# Patient Record
Sex: Female | Born: 1950 | Race: Black or African American | Hispanic: No | Marital: Single | Smoking: Never smoker
Health system: Southern US, Community
[De-identification: ages and names within clinical notes are randomized; demographics above are authoritative.]

## PROBLEM LIST (undated history)

## (undated) DIAGNOSIS — B1920 Unspecified viral hepatitis C without hepatic coma: Secondary | ICD-10-CM

## (undated) HISTORY — PX: TOTAL HIP ARTHROPLASTY: SHX124

## (undated) HISTORY — PX: BREAST EXCISIONAL BIOPSY: SUR124

---

## 2020-07-29 ENCOUNTER — Encounter: Payer: Self-pay | Admitting: Emergency Medicine

## 2020-07-29 ENCOUNTER — Other Ambulatory Visit: Payer: Self-pay

## 2020-07-29 ENCOUNTER — Emergency Department
Admission: EM | Admit: 2020-07-29 | Discharge: 2020-07-29 | Disposition: A | Discharge status: Home / Self Care | Payer: BC Managed Care – PPO | Attending: Emergency Medicine | Admitting: Emergency Medicine

## 2020-07-29 ENCOUNTER — Emergency Department: Payer: BC Managed Care – PPO

## 2020-07-29 DIAGNOSIS — M7989 Other specified soft tissue disorders: Secondary | ICD-10-CM | POA: Diagnosis not present

## 2020-07-29 DIAGNOSIS — Z96641 Presence of right artificial hip joint: Secondary | ICD-10-CM | POA: Diagnosis not present

## 2020-07-29 HISTORY — DX: Unspecified viral hepatitis C without hepatic coma: B19.20

## 2020-07-29 NOTE — ED Triage Notes (Signed)
First nurse note- here for [place], r/o dvt.  Recent surgery now swelling right calf. NAD

## 2020-07-29 NOTE — ED Provider Notes (Signed)
[facility] Emergency Department Provider Note   ____________________________________________    I have reviewed the triage vital signs and the nursing notes.   HISTORY  Chief Complaint Leg Swelling     HPI Latasha Hall is a 69 y.o. female who presents with complaints of right leg swelling and pain.  Patient recently had right hip replacement.  She was at physical therapy today complaining of pain and swelling and was sent to the emergency department for evaluation for possible DVT.  No chest pain shortness of breath or pleurisy.  No fevers or chills.  Reports gradually increased ambulation after surgery.  No history of DVTs in the past.  Past Medical History:  Diagnosis Date  . Hepatitis C     There are no problems to display for this patient.   Past Surgical History:  Procedure Laterality Date  . TOTAL HIP ARTHROPLASTY      Prior to Admission medications   Not on File     Allergies Other  History reviewed. No pertinent family history.  Social History Social History   Tobacco Use  . Smoking status: Never Smoker  . Smokeless tobacco: Never Used  Substance Use Topics  . Alcohol use: Never  . Drug use: Never    Review of Systems  Constitutional: No fever/chills  Cardiovascular: Denies chest pain. Respiratory: Denies shortness of breath. Gastrointestinal: No abdominal pain.  Genitourinary: Negative for dysuria. Musculoskeletal: As above Skin: Negative for rash. Neurological: Negative for headaches    ____________________________________________   PHYSICAL EXAM:  VITAL SIGNS: ED Triage Vitals  Enc Vitals Group     BP 07/29/20 1446 114/71     Pulse Rate 07/29/20 1446 65     Resp 07/29/20 1446 16     Temp 07/29/20 1446 98.5 F (36.9 C)     Temp Source 07/29/20 1446 Oral     SpO2 07/29/20 1446 98 %     Weight 07/29/20 1442 56.2 kg (124 lb)     Height 07/29/20 1442 1.524 m (5')     Head Circumference --      Peak  Flow --      Pain Score 07/29/20 1442 5     Pain Loc --      Pain Edu? --      Excl. in GC? --     Constitutional: Alert and oriented.  Pleasant and interactive  Nose: No congestion/rhinnorhea. Mouth/Throat: Mucous membranes are moist.   Neck:  Painless ROM Cardiovascular: Normal rate, regular rhythm. Grossly normal heart sounds.  Good peripheral circulation. Respiratory: Normal respiratory effort.  No retractions. Lungs CTAB. Gastrointestinal: Soft and nontender. No distention.  No CVA tenderness. Genitourinary: deferred Musculoskeletal: Mild tenderness palpation in the calf, edema to the leg noted, 2+ distal pulses, warm and well perfused, painful range of motion at the hip Neurologic:  Normal speech and language. No gross focal neurologic deficits are appreciated.  Skin:  Skin is warm, dry and intact. No rash noted. Psychiatric: Mood and affect are normal. Speech and behavior are normal.  ____________________________________________   LABS (all labs ordered are listed, but only abnormal results are displayed)  Labs Reviewed - No data to display ____________________________________________  EKG  None ____________________________________________  RADIOLOGY  Ultrasound negative for DVT ____________________________________________   PROCEDURES  Procedure(s) performed: No  Procedures   Critical Care performed: No ____________________________________________   INITIAL IMPRESSION / ASSESSMENT AND PLAN / ED COURSE  Pertinent labs & imaging results that were available during my care  of the patient were reviewed by me and considered in my medical decision making (see chart for details).  Patient sent in for evaluation of swelling to the right leg, concern for DVT given recent hip surgery.  Ultrasound demonstrates no evidence of DVT, patient greatly reassured by this, can continue physical therapy outpatient follow-up with orthopedics.      ____________________________________________   FINAL CLINICAL IMPRESSION(S) / ED DIAGNOSES  Final diagnoses:  Leg swelling        Note:  This document was prepared using Dragon voice recognition software and may include unintentional dictation errors.   Jene Every, MD 07/29/20 [id number]

## 2020-07-29 NOTE — ED Triage Notes (Signed)
Here for right calf swelling. Recent hip replacement.  Pt just noticed today, sent for r/o DVT

## 2021-06-18 IMAGING — US US EXTREM LOW VENOUS*R*
1 series · 13 of 24 positions shown · non-contrast
Comparison: None.

CLINICAL DATA: Right leg swelling.  Recent surgery



[Series 1: us venous img lower uni right (dvt) · portal-venous · 13 of 34 slices shown]
[im 1/34]
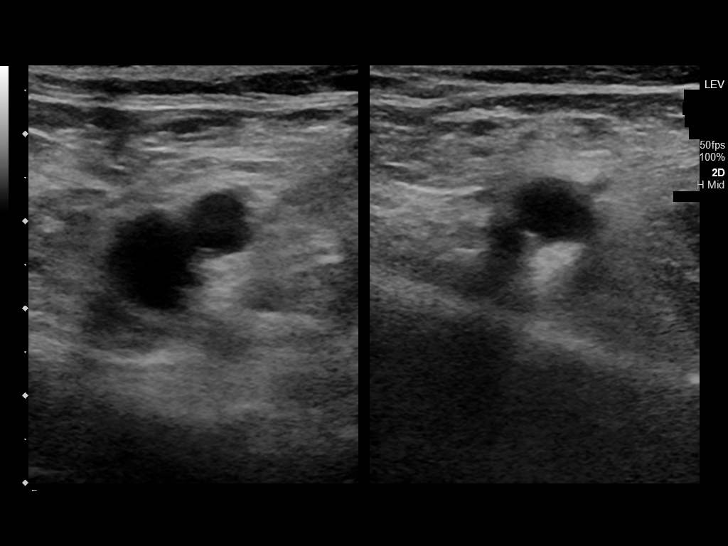
[im 3/34]
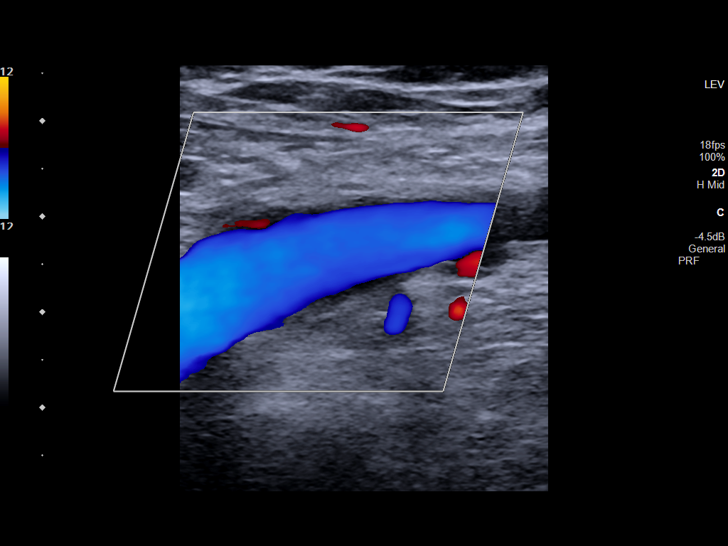
[im 6/34]
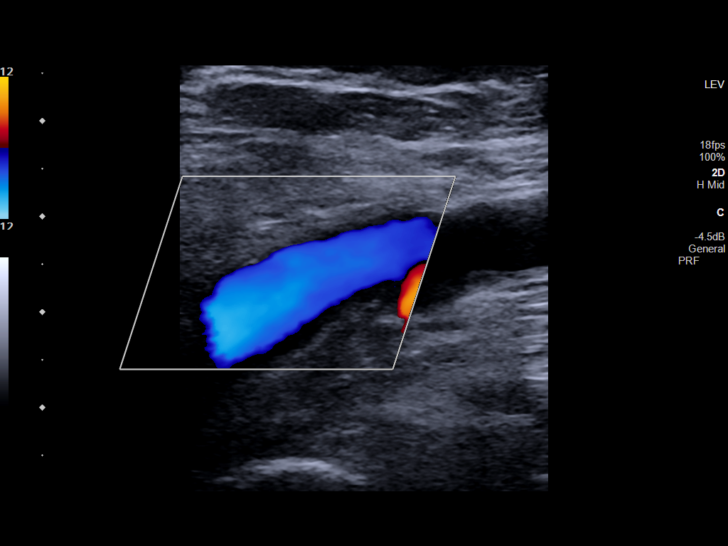
[im 9/34]
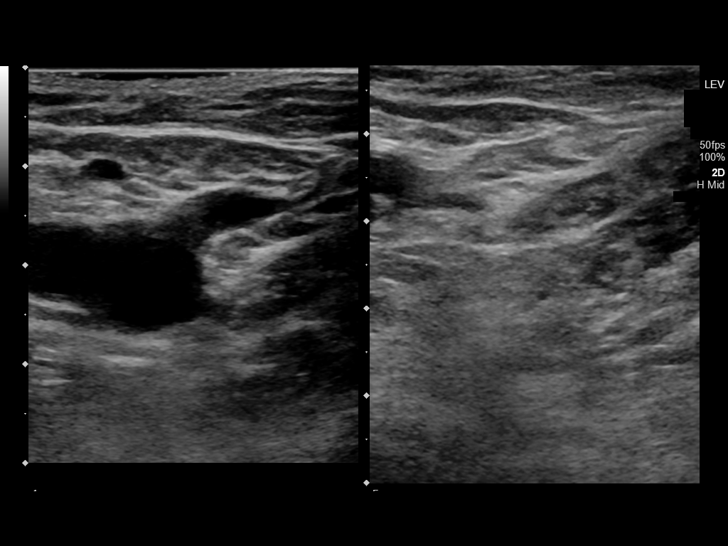
[im 12/34]
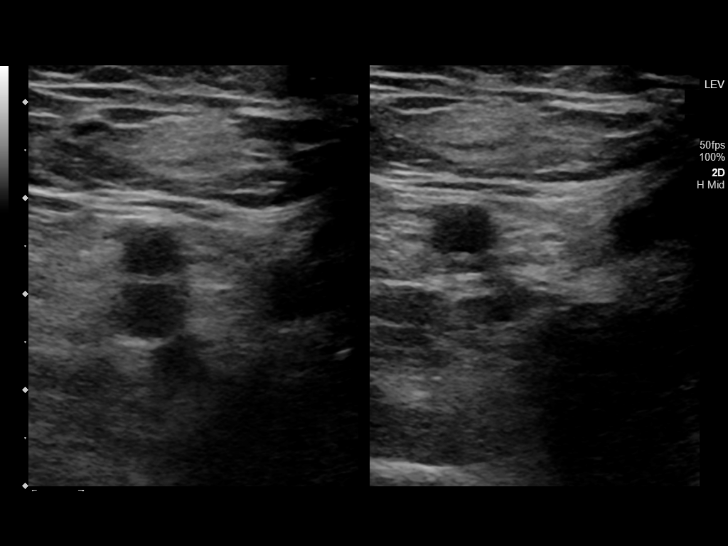
[im 15/34]
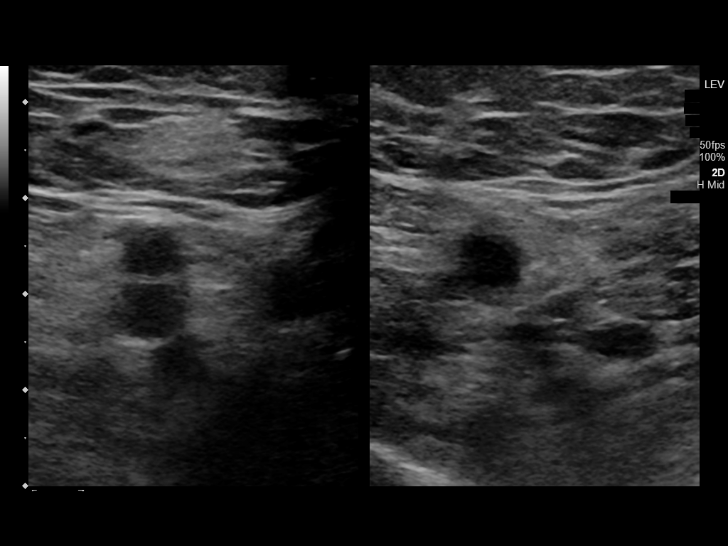
[im 18/34]
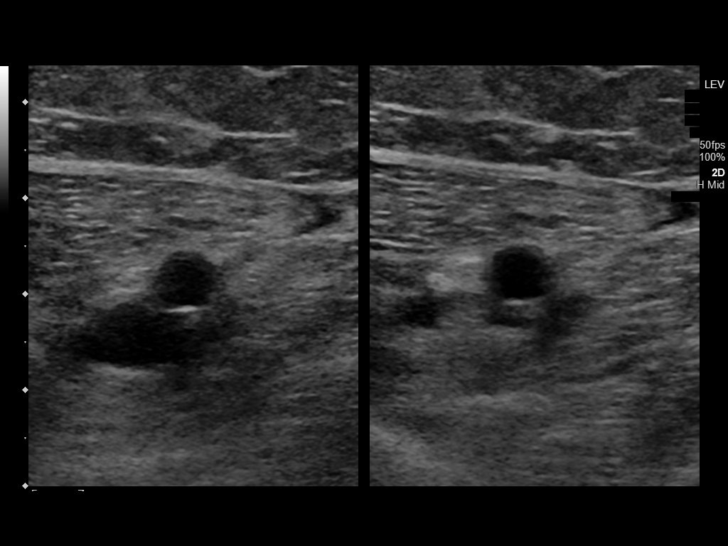
[im 19/34]
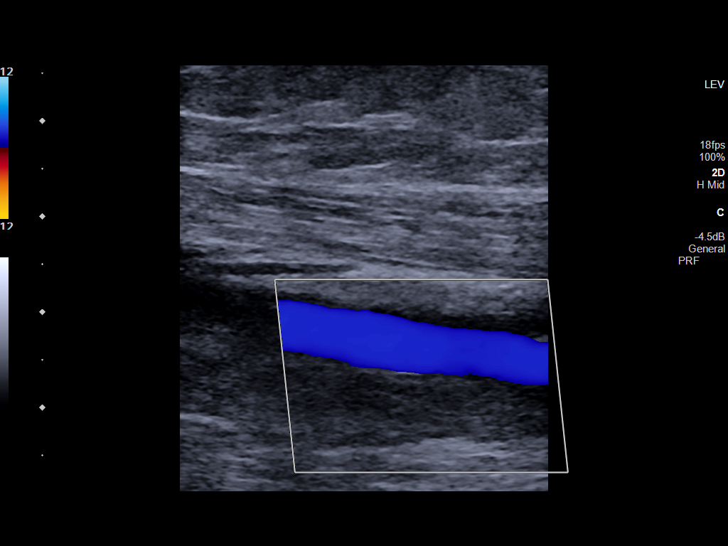
[im 22/34]
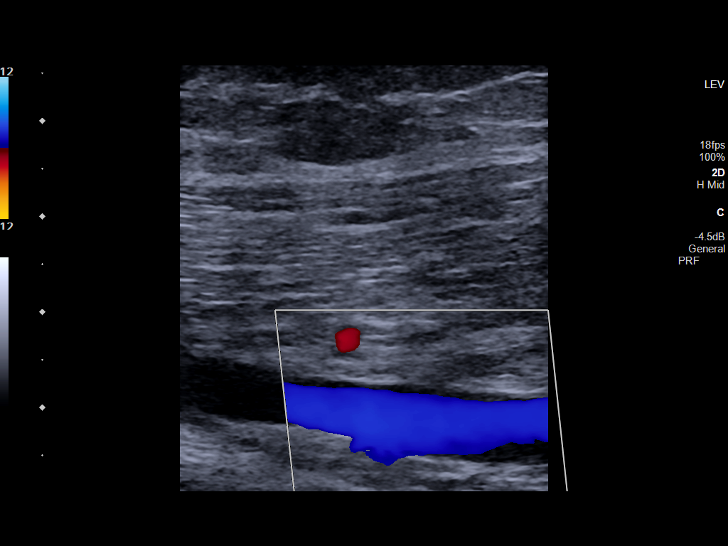
[im 25/34]
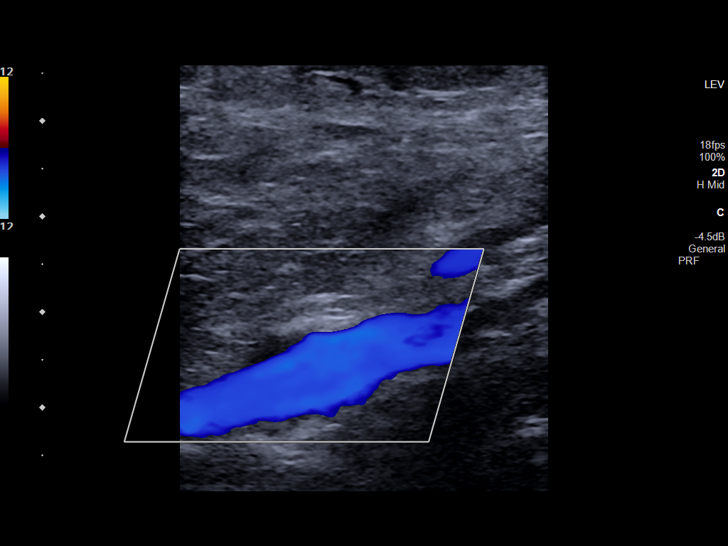
[im 28/34]
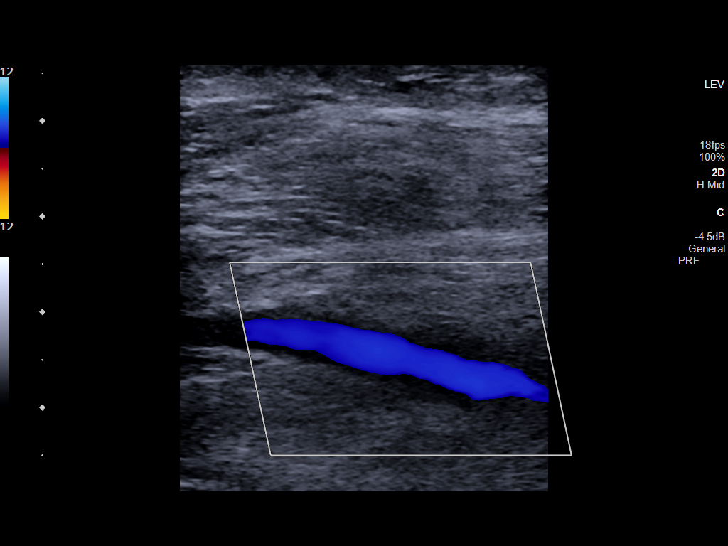
[im 31/34]
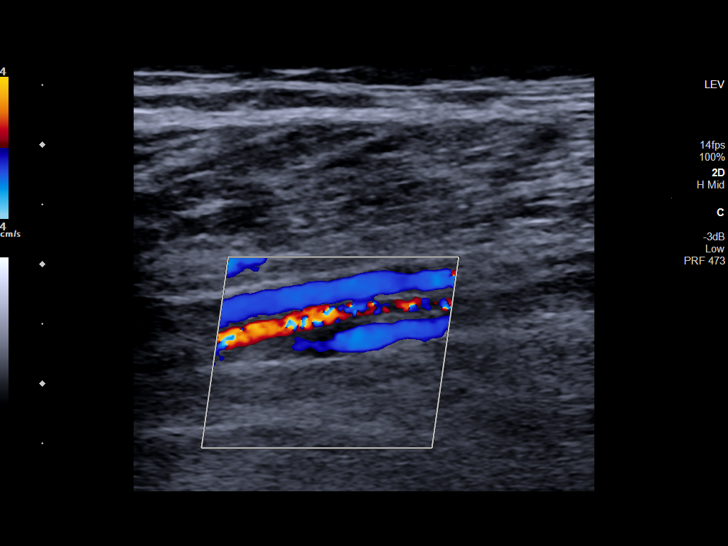
[im 34/34]
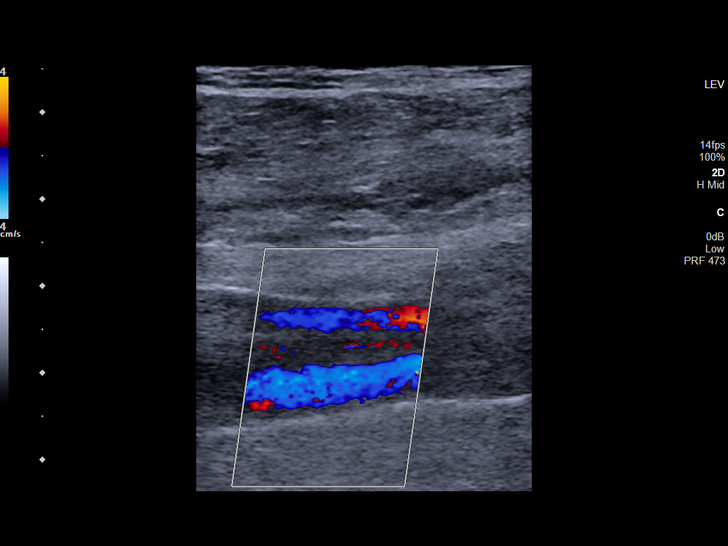

[13 of 24 positions shown; findings below may reference images not displayed]

FINDINGS: Contralateral Common Femoral Vein: Respiratory phasicity is normal
and symmetric with the symptomatic side. No evidence of thrombus.
Normal compressibility.

Common Femoral Vein: No evidence of thrombus. Normal
compressibility, respiratory phasicity and response to augmentation.

Saphenofemoral Junction: No evidence of thrombus. Normal
compressibility and flow on color Doppler imaging.

Profunda Femoral Vein: No evidence of thrombus. Normal
compressibility and flow on color Doppler imaging.

Femoral Vein: No evidence of thrombus. Normal compressibility,
respiratory phasicity and response to augmentation.

Popliteal Vein: No evidence of thrombus. Normal compressibility,
respiratory phasicity and response to augmentation.

Calf Veins: No evidence of thrombus. Normal compressibility and flow
on color Doppler imaging.

Superficial Great Saphenous Vein: No evidence of thrombus. Normal
compressibility.

Venous Reflux:  None.

Other Findings:  None.
IMPRESSION: No evidence of right lower extremity deep venous thrombosis.

## 2023-04-28 ENCOUNTER — Other Ambulatory Visit: Payer: Self-pay | Admitting: Family Medicine

## 2023-04-28 DIAGNOSIS — Z1231 Encounter for screening mammogram for malignant neoplasm of breast: Secondary | ICD-10-CM

## 2023-08-08 ENCOUNTER — Ambulatory Visit
Admission: RE | Admit: 2023-08-08 | Discharge: 2023-08-08 | Disposition: A | Discharge status: Home / Self Care | Payer: Medicare PPO | Source: Ambulatory Visit | Attending: Family Medicine | Admitting: Family Medicine

## 2023-08-08 DIAGNOSIS — Z1231 Encounter for screening mammogram for malignant neoplasm of breast: Secondary | ICD-10-CM | POA: Diagnosis present

## 2023-08-09 ENCOUNTER — Inpatient Hospital Stay
Admission: RE | Admit: 2023-08-09 | Discharge: 2023-08-09 | Disposition: A | Discharge status: Home / Self Care | Payer: Self-pay | Source: Ambulatory Visit | Attending: Family Medicine | Admitting: Family Medicine

## 2023-08-09 ENCOUNTER — Other Ambulatory Visit: Payer: Self-pay | Admitting: *Deleted

## 2023-08-09 DIAGNOSIS — Z1231 Encounter for screening mammogram for malignant neoplasm of breast: Secondary | ICD-10-CM

## 2024-04-10 ENCOUNTER — Other Ambulatory Visit: Payer: Self-pay | Admitting: Family Medicine

## 2024-04-10 DIAGNOSIS — N951 Menopausal and female climacteric states: Secondary | ICD-10-CM

## 2024-06-04 ENCOUNTER — Ambulatory Visit
Admission: RE | Admit: 2024-06-04 | Discharge: 2024-06-04 | Disposition: A | Discharge status: Home / Self Care | Source: Ambulatory Visit | Attending: Family Medicine | Admitting: Family Medicine

## 2024-06-04 DIAGNOSIS — N951 Menopausal and female climacteric states: Secondary | ICD-10-CM | POA: Diagnosis present

## 2024-09-07 ENCOUNTER — Other Ambulatory Visit: Payer: Self-pay | Admitting: Family Medicine

## 2024-09-07 DIAGNOSIS — Z1231 Encounter for screening mammogram for malignant neoplasm of breast: Secondary | ICD-10-CM

## 2024-10-24 ENCOUNTER — Ambulatory Visit
Admission: RE | Admit: 2024-10-24 | Discharge: 2024-10-24 | Disposition: A | Discharge status: Home / Self Care | Source: Ambulatory Visit | Attending: Family Medicine | Admitting: Family Medicine

## 2024-10-24 DIAGNOSIS — Z1231 Encounter for screening mammogram for malignant neoplasm of breast: Secondary | ICD-10-CM | POA: Diagnosis present
